# Patient Record
Sex: Male | Born: 1970 | Hispanic: No | Marital: Married | Smoking: Current every day smoker
Health system: Southern US, Community
[De-identification: ages and names within clinical notes are randomized; demographics above are authoritative.]

## PROBLEM LIST (undated history)

## (undated) DIAGNOSIS — J189 Pneumonia, unspecified organism: Secondary | ICD-10-CM

---

## 2007-01-01 ENCOUNTER — Emergency Department: Payer: Self-pay | Admitting: Unknown Physician Specialty

## 2016-06-30 ENCOUNTER — Other Ambulatory Visit: Payer: Self-pay | Admitting: Gastroenterology

## 2016-06-30 DIAGNOSIS — R1084 Generalized abdominal pain: Secondary | ICD-10-CM

## 2018-04-24 ENCOUNTER — Other Ambulatory Visit: Payer: Self-pay

## 2018-04-24 ENCOUNTER — Emergency Department: Payer: BLUE CROSS/BLUE SHIELD

## 2018-04-24 ENCOUNTER — Emergency Department
Admission: EM | Admit: 2018-04-24 | Discharge: 2018-04-24 | Disposition: A | Discharge status: Home / Self Care | Payer: BLUE CROSS/BLUE SHIELD | Attending: Emergency Medicine | Admitting: Emergency Medicine

## 2018-04-24 ENCOUNTER — Encounter: Payer: Self-pay | Admitting: Emergency Medicine

## 2018-04-24 DIAGNOSIS — R0602 Shortness of breath: Secondary | ICD-10-CM | POA: Diagnosis not present

## 2018-04-24 DIAGNOSIS — F17200 Nicotine dependence, unspecified, uncomplicated: Secondary | ICD-10-CM | POA: Diagnosis not present

## 2018-04-24 DIAGNOSIS — J209 Acute bronchitis, unspecified: Secondary | ICD-10-CM | POA: Insufficient documentation

## 2018-04-24 DIAGNOSIS — R05 Cough: Secondary | ICD-10-CM | POA: Diagnosis present

## 2018-04-24 HISTORY — DX: Pneumonia, unspecified organism: J18.9

## 2018-04-24 LAB — CBC WITH DIFFERENTIAL/PLATELET
BASOS ABS: 0.1 10*3/uL (ref 0–0.1)
BASOS PCT: 1 %
EOS ABS: 0.3 10*3/uL (ref 0–0.7)
Eosinophils Relative: 3 %
HCT: 43.3 % (ref 40.0–52.0)
HEMOGLOBIN: 15.4 g/dL (ref 13.0–18.0)
Lymphocytes Relative: 26 %
Lymphs Abs: 2.9 10*3/uL (ref 1.0–3.6)
MCH: 30.5 pg (ref 26.0–34.0)
MCHC: 35.6 g/dL (ref 32.0–36.0)
MCV: 85.6 fL (ref 80.0–100.0)
MONO ABS: 0.8 10*3/uL (ref 0.2–1.0)
Monocytes Relative: 7 %
NEUTROS PCT: 63 %
Neutro Abs: 7.4 10*3/uL — ABNORMAL HIGH (ref 1.4–6.5)
Platelets: 158 10*3/uL (ref 150–440)
RBC: 5.06 MIL/uL (ref 4.40–5.90)
RDW: 14.2 % (ref 11.5–14.5)
WBC: 11.5 10*3/uL — ABNORMAL HIGH (ref 3.8–10.6)

## 2018-04-24 LAB — COMPREHENSIVE METABOLIC PANEL
ALBUMIN: 4.5 g/dL (ref 3.5–5.0)
ALT: 36 U/L (ref 0–44)
ANION GAP: 6 (ref 5–15)
AST: 19 U/L (ref 15–41)
Alkaline Phosphatase: 73 U/L (ref 38–126)
BILIRUBIN TOTAL: 0.7 mg/dL (ref 0.3–1.2)
BUN: 15 mg/dL (ref 6–20)
CHLORIDE: 107 mmol/L (ref 98–111)
CO2: 24 mmol/L (ref 22–32)
Calcium: 9 mg/dL (ref 8.9–10.3)
Creatinine, Ser: 0.97 mg/dL (ref 0.61–1.24)
GFR calc Af Amer: >60 mL/min (ref >60)
GFR calc non Af Amer: >60 mL/min (ref >60)
Glucose, Bld: 111 mg/dL — ABNORMAL HIGH (ref 70–99)
POTASSIUM: 3.3 mmol/L — AB (ref 3.5–5.1)
SODIUM: 137 mmol/L (ref 135–145)
Total Protein: 7.2 g/dL (ref 6.5–8.1)

## 2018-04-24 LAB — TROPONIN I: Troponin I: <0.03 ng/mL (ref <0.03)

## 2018-04-24 MED ORDER — ALBUTEROL SULFATE HFA 108 (90 BASE) MCG/ACT IN AERS: 2.0000 | INHALATION_SPRAY | Freq: Four times a day (QID) | RESPIRATORY_TRACT | 0 refills | Status: AC | PRN

## 2018-04-24 MED ORDER — AZITHROMYCIN 500 MG PO TABS: 500.0000 mg | ORAL_TABLET | Freq: Every day | ORAL | 0 refills | Status: AC

## 2018-04-24 MED ORDER — BENZONATATE 100 MG PO CAPS: 100.0000 mg | ORAL_CAPSULE | Freq: Three times a day (TID) | ORAL | 0 refills | Status: AC | PRN

## 2018-04-24 MED ORDER — PREDNISONE 20 MG PO TABS: 60.0000 mg | ORAL_TABLET | Freq: Every day | ORAL | 0 refills | Status: AC

## 2018-04-24 MED ORDER — IPRATROPIUM-ALBUTEROL 0.5-2.5 (3) MG/3ML IN SOLN
3.0000 mL | Freq: Once | RESPIRATORY_TRACT | Status: AC
  Administered 2018-04-24: 3 mL via RESPIRATORY_TRACT
  Filled 2018-04-24: qty 3

## 2018-04-24 MED ORDER — IOHEXOL 350 MG/ML SOLN
75.0000 mL | Freq: Once | INTRAVENOUS | Status: AC | PRN
  Administered 2018-04-24: 75 mL via INTRAVENOUS

## 2018-04-24 NOTE — ED Triage Notes (Addendum)
Patient ambulatory to triage with steady gait, without difficulty or distress noted; pt reports tonight chest tightness and hemoptysis with some SHOB;  prod cough green sputum several wks; st hx pneumonia; mask placed on pt with instructions to wear until placed in exam room

## 2018-04-24 NOTE — ED Notes (Signed)
Pt resting, with VSS, in NAD at this time, displaying equal and unlabored respirations. Pt family at bedside. All deny needs at this time. Will continue to monitor.

## 2018-04-24 NOTE — ED Notes (Signed)
ED Provider at bedside going over discharge instructions

## 2018-04-24 NOTE — ED Provider Notes (Signed)
[facility] Emergency Department Provider Note    First MD Initiated Contact with Patient 04/24/18 [phone]     (approximate)  I have reviewed the triage vital signs and the nursing notes.   HISTORY  Chief Complaint Cough and Shortness of Breath    HPI Ryan Sparks is a 47 y.o. male with history of pneumonia and half a pack cigarette use daily presents to the emergency department with 1 month history of productive cough with green sputum.  Patient states that tonight he had a episode of hemoptysis that was approximately half a cup to a cup of blood.  Patient denies any fever afebrile on presentation.   Past Medical History:  Diagnosis Date  . Pneumonia     There are no active problems to display for this patient.  Past surgical history None  Prior to Admission medications   Medication Sig Start Date End Date Taking? Authorizing Provider  albuterol (PROVENTIL HFA;VENTOLIN HFA) 108 (90 Base) MCG/ACT inhaler Inhale 2 puffs into the lungs every 6 (six) hours as needed for wheezing or shortness of breath. 04/24/18   Darci Current, MD  azithromycin (ZITHROMAX) 500 MG tablet Take 1 tablet (500 mg total) by mouth daily for 3 days. Take 1 tablet daily for 3 days. 04/24/18 04/27/18  Darci Current, MD  benzonatate (TESSALON PERLES) 100 MG capsule Take 1 capsule (100 mg total) by mouth 3 (three) times daily as needed for cough. 04/24/18 04/24/19  Darci Current, MD  predniSONE (DELTASONE) 20 MG tablet Take 3 tablets (60 mg total) by mouth daily for 5 days. 04/24/18 04/29/18  Darci Current, MD    Allergies No known drug allergies No family history on file.  Social History Social History   Tobacco Use  . Smoking status: Current Every Day Smoker  . Smokeless tobacco: Never Used  Substance Use Topics  . Alcohol use: Not on file  . Drug use: Not on file    Review of Systems Constitutional: No fever/chills Eyes: No visual changes. ENT: No sore  throat. Cardiovascular: Denies chest pain. Respiratory: Positive for cough, hemoptysis. Gastrointestinal: No abdominal pain.  No nausea, no vomiting.  No diarrhea.  No constipation. Genitourinary: Negative for dysuria. Musculoskeletal: Negative for neck pain.  Negative for back pain. Integumentary: Negative for rash. Neurological: Negative for headaches, focal weakness or numbness.   ____________________________________________   PHYSICAL EXAM:  VITAL SIGNS: ED Triage Vitals [04/24/18 0020]  Enc Vitals Group     BP (!) 149/101     Pulse Rate 68     Resp 18     Temp 97.8 F (36.6 C)     Temp Source Oral     SpO2 97 %     Weight 73.5 kg (162 lb)     Height 1.575 m (5\' 2" )     Head Circumference      Peak Flow      Pain Score 3     Pain Loc      Pain Edu?      Excl. in GC?      Constitutional: Alert and oriented. Well appearing and in no acute distress. Eyes: Conjunctivae are normal. PERRL. EOMI. Head: Atraumatic. Mouth/Throat: Mucous membranes are moist.  Oropharynx non-erythematous. Neck: No stridor.   Cardiovascular: Normal rate, regular rhythm. Good peripheral circulation. Grossly normal heart sounds. Respiratory: Normal respiratory effort.  No retractions.  Diffuse rhonchi with expiratory wheezes. Gastrointestinal: Soft and nontender. No distention.  Musculoskeletal: No lower extremity  tenderness nor edema. No gross deformities of extremities. Neurologic:  Normal speech and language. No gross focal neurologic deficits are appreciated.  Skin:  Skin is warm, dry and intact. No rash noted. Psychiatric: Mood and affect are normal. Speech and behavior are normal.  ____________________________________________   LABS (all labs ordered are listed, but only abnormal results are displayed)  Labs Reviewed  CBC WITH DIFFERENTIAL/PLATELET - Abnormal; Notable for the following components:      Result Value   WBC 11.5 (*)    Neutro Abs 7.4 (*)    All other components  within normal limits  COMPREHENSIVE METABOLIC PANEL - Abnormal; Notable for the following components:   Potassium 3.3 (*)    Glucose, Bld 111 (*)    All other components within normal limits  TROPONIN I   ____________________________________________  EKG  ED ECG REPORT I, [place] N Sachin Ferencz, the attending physician, personally viewed and interpreted this ECG.   Date: 04/24/2018  EKG Time: 12:38 AM  Rate: 69  Rhythm: Normal sinus rhythm  Axis: Normal  Intervals: Normal  ST&T Change: None  ____________________________________________  RADIOLOGY I, [place] N Lazaro Isenhower, personally viewed and evaluated these images (plain radiographs) as part of my medical decision making, as well as reviewing the written report by the radiologist.  ED MD interpretation: Findings concerning for lingular pneumonia.  Official radiology report(s): Dg Chest 2 View  Result Date: 04/24/2018 CLINICAL DATA:  Cough EXAM: CHEST - 2 VIEW COMPARISON:  None. FINDINGS: Normal heart size. Normal mediastinal contour. No pneumothorax. No pleural effusion. Mild patchy opacity in the lingula. IMPRESSION: Mild patchy opacity in the lingula, suggestive of a lingular pneumonia. Recommend follow-up PA and lateral post treatment chest radiographs in 4-6 weeks. Electronically Signed   By: Delbert PhenixJason A Poff M.D.   On: 04/24/2018 01:25   Ct Angio Chest Pe W And/or Wo Contrast  Result Date: 04/24/2018 CLINICAL DATA:  47 y/o M; chest tightness, hemoptysis, shortness of breath. EXAM: CT ANGIOGRAPHY CHEST WITH CONTRAST TECHNIQUE: Multidetector CT imaging of the chest was performed using the standard protocol during bolus administration of intravenous contrast. Multiplanar CT image reconstructions and MIPs were obtained to evaluate the vascular anatomy. CONTRAST:  75mL OMNIPAQUE IOHEXOL 350 MG/ML SOLN COMPARISON:  None. FINDINGS: Cardiovascular: Satisfactory opacification of the pulmonary arteries to the segmental level. No evidence of  pulmonary embolism. Normal heart size. No pericardial effusion. Mild aortic calcific atherosclerosis. Mediastinum/Nodes: No enlarged mediastinal, hilar, or axillary lymph nodes. Thyroid gland, trachea, and esophagus demonstrate no significant findings. Lungs/Pleura: Diffuse peribronchial thickening greatest in the lower lobes. Mild right middle lobe bronchiectasis. No consolidation. No pleural effusion or pneumothorax. Upper Abdomen: No acute abnormality. Musculoskeletal: No chest wall abnormality. No acute or significant osseous findings. Review of the MIP images confirms the above findings. IMPRESSION: 1. No pulmonary embolus identified. 2. Diffuse peribronchial thickening compatible with acute bronchitis or reactive airways disease. Mild bronchiectasis within the right middle lobe. No consolidation. 3.  Aortic Atherosclerosis (ICD10-I70.0). Electronically Signed   By: Mitzi HansenLance  Furusawa-Stratton M.D.   On: 04/24/2018 06:20      Critical Care performed:   Procedures   ____________________________________________   INITIAL IMPRESSION / ASSESSMENT AND PLAN / ED COURSE  As part of my medical decision making, I reviewed the following data within the electronic MEDICAL RECORD NUMBER   47 year old male presents with above-stated history and physical exam cough and hemoptysis chest x-ray findings consistent with possible lingular pneumonia CT scan findings more consistent with bronchitis.  Patient given azithromycin Tessalon  prednisone and albuterol inhaler for home with recommendation to follow-up with primary care provider for further outpatient management.  Also discussed smoking cessation with the patient as well. ____________________________________________  FINAL CLINICAL IMPRESSION(S) / ED DIAGNOSES  Final diagnoses:  Acute bronchitis, unspecified organism     MEDICATIONS GIVEN DURING THIS VISIT:  Medications  ipratropium-albuterol (DUONEB) 0.5-2.5 (3) MG/3ML nebulizer solution 3 mL (3 mLs  Nebulization Given 04/24/18 0415)  iohexol (OMNIPAQUE) 350 MG/ML injection 75 mL (75 mLs Intravenous Contrast Given 04/24/18 0445)     ED Discharge Orders         Ordered    azithromycin (ZITHROMAX) 500 MG tablet  Daily     04/24/18 0625    albuterol (PROVENTIL HFA;VENTOLIN HFA) 108 (90 Base) MCG/ACT inhaler  Every 6 hours PRN     04/24/18 0625    predniSONE (DELTASONE) 20 MG tablet  Daily     04/24/18 0625    benzonatate (TESSALON PERLES) 100 MG capsule  3 times daily PRN     04/24/18 1610           Note:  This document was prepared using Dragon voice recognition software and may include unintentional dictation errors.    Darci Current, MD 04/24/18 2222

## 2018-04-24 NOTE — ED Notes (Signed)
Patient transported to CT 

## 2019-05-15 IMAGING — CR DG CHEST 2V
2 series · 2 of 2 positions shown · non-contrast
Comparison: None.

CLINICAL DATA: Cough

EXAM:
CHEST - 2 VIEW

[chest pa]
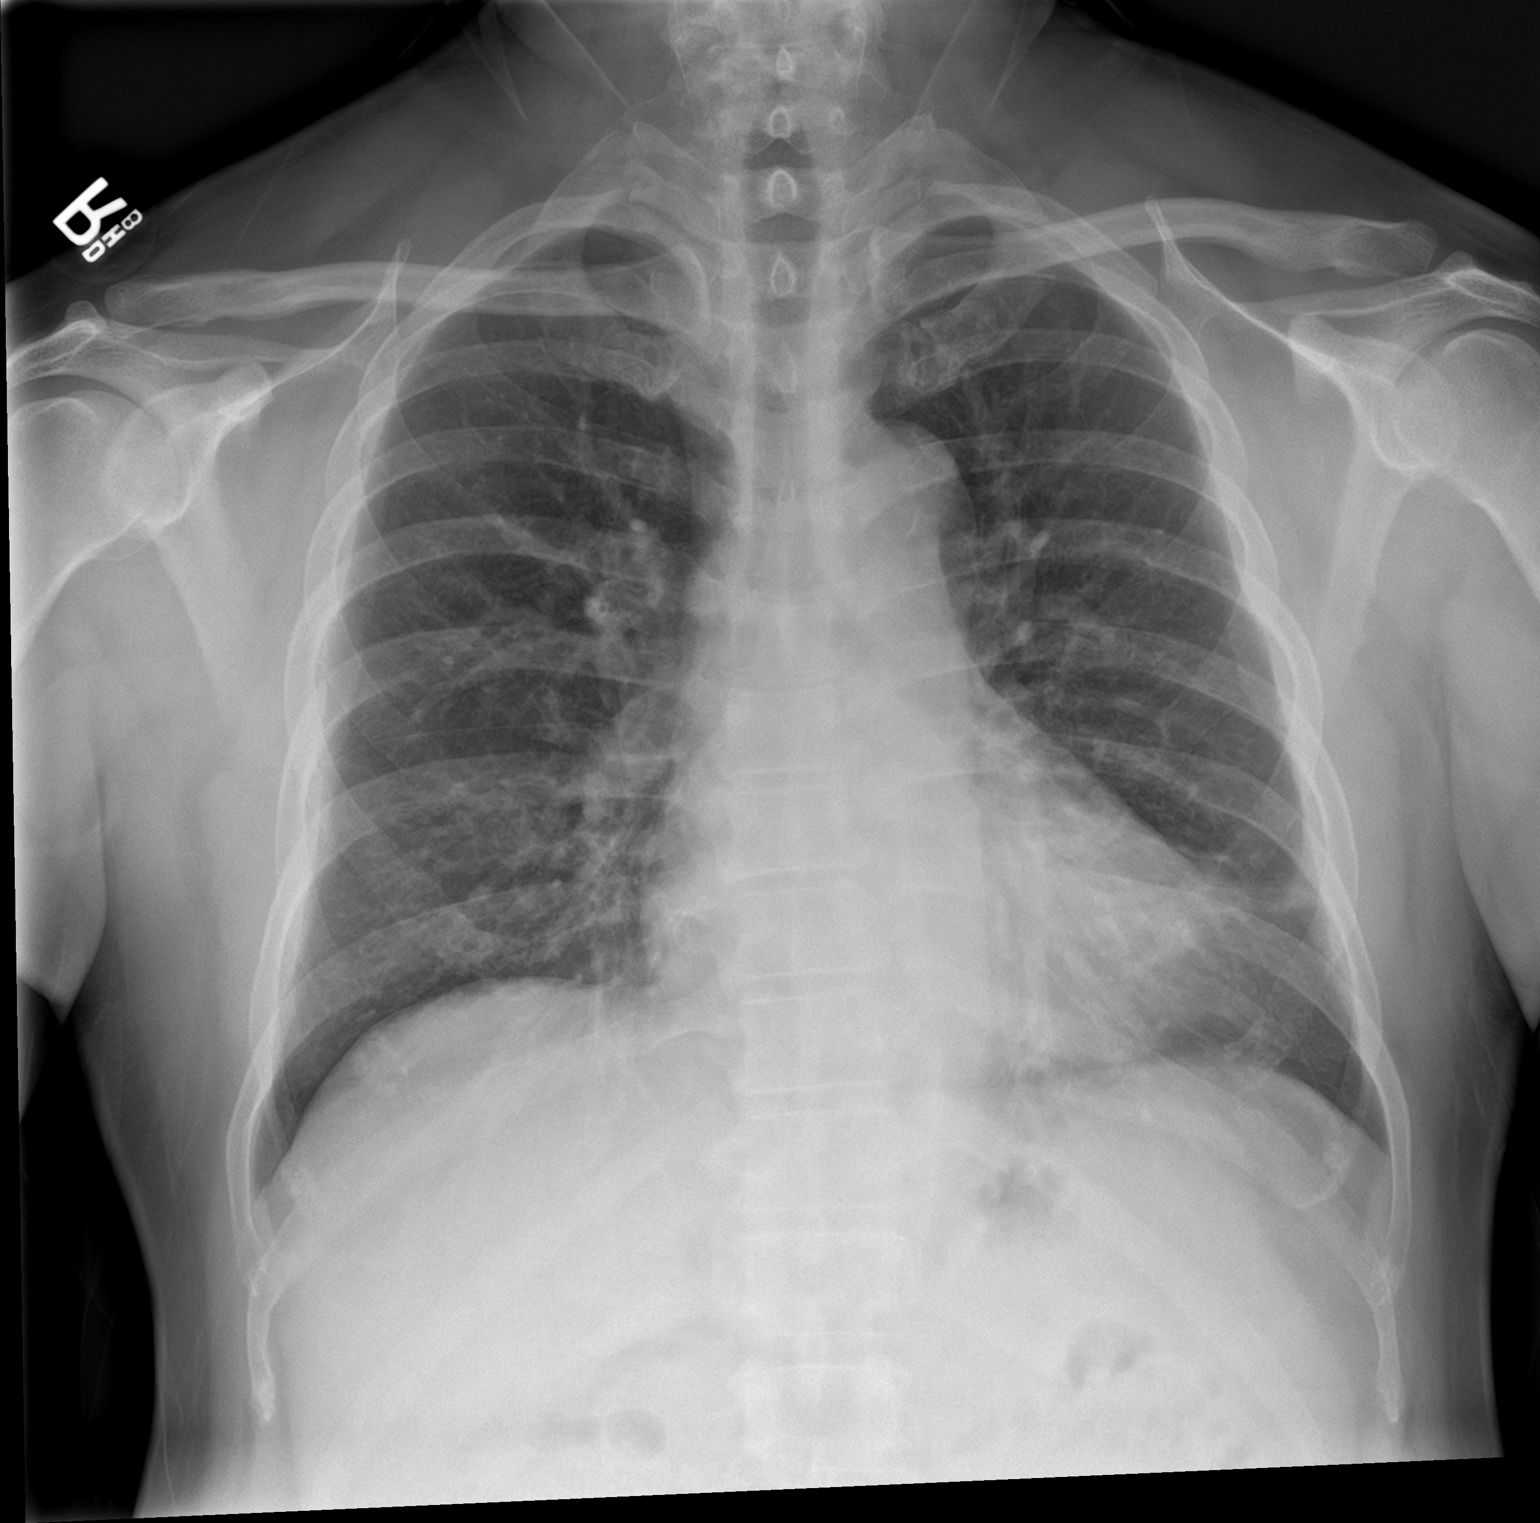

[chest lat]
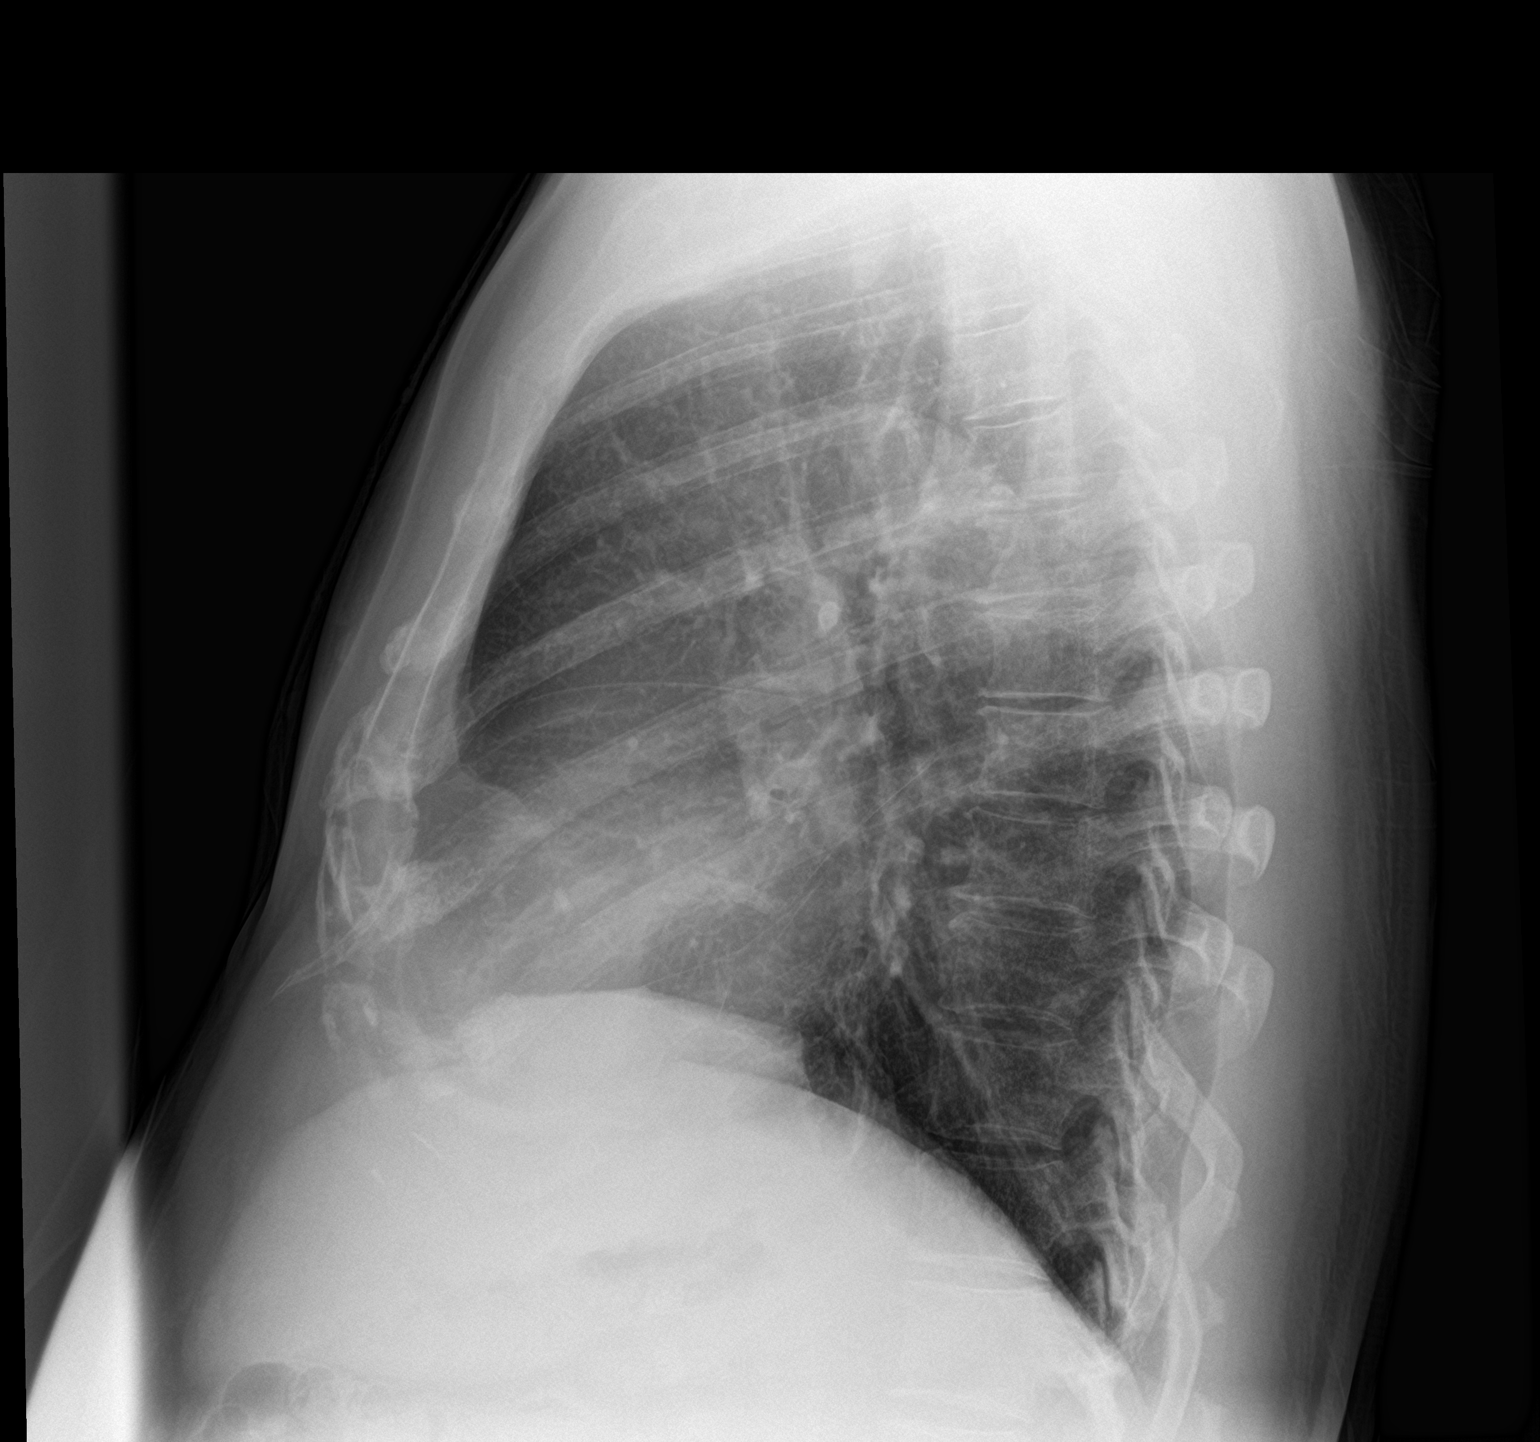

[2 of 2 positions shown; findings below may reference images not displayed]

FINDINGS: Normal heart size. Normal mediastinal contour. No pneumothorax. No
pleural effusion. Mild patchy opacity in the lingula.
IMPRESSION: Mild patchy opacity in the lingula, suggestive of a lingular
pneumonia. Recommend follow-up PA and lateral post treatment chest
radiographs in 4-6 weeks.

## 2019-05-15 IMAGING — CT CT ANGIO CHEST
2 of 6 series · 19 of 46 positions shown · IV contrast (APPLIED)
Comparison: None.

CLINICAL DATA: 46 y/o M; chest tightness, hemoptysis, shortness of
breath.

EXAM:
CT ANGIOGRAPHY CHEST WITH CONTRAST
TECHNIQUE: Multidetector CT imaging of the chest was performed using the
standard protocol during bolus administration of intravenous
contrast. Multiplanar CT image reconstructions and MIPs were
obtained to evaluate the vascular anatomy.
CONTRAST:  75mL OMNIPAQUE IOHEXOL 350 MG/ML SOLN

[Series 5: thins · axial · 0.66mm/px · z∈[-668,-391]mm · 16 of 305 slices shown]
[im 14/305  lung]
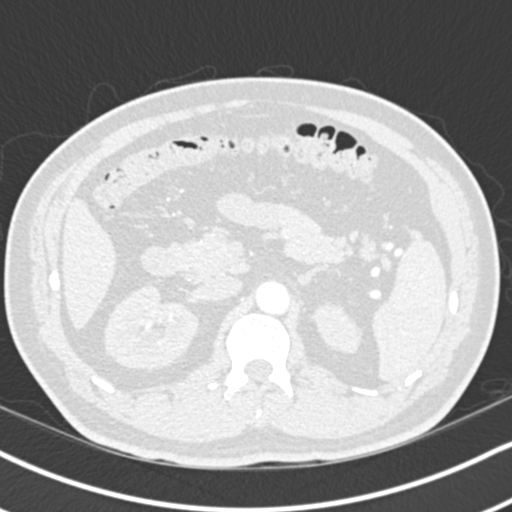
[im 40/305  soft-tissue]
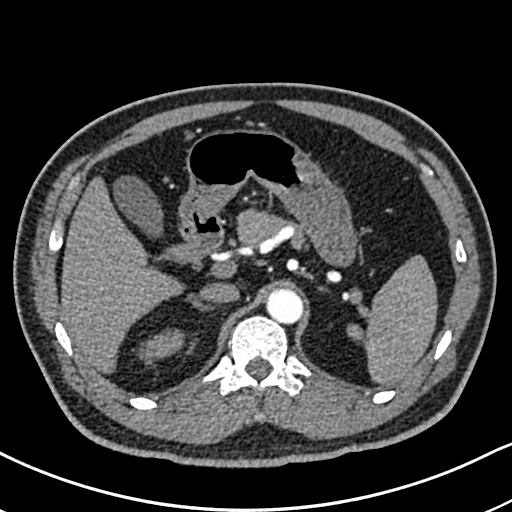
[im 53/305  lung]
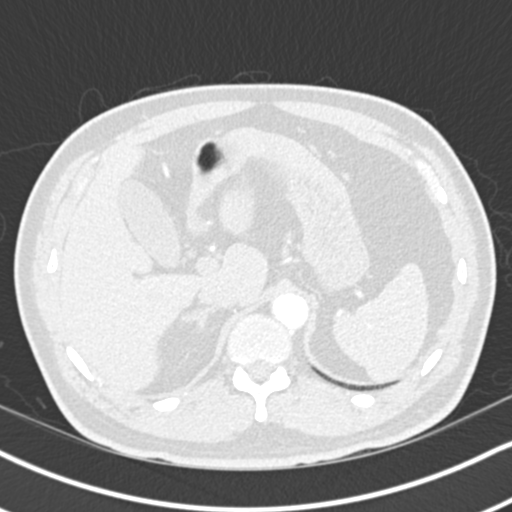
[im 67/305  soft-tissue]
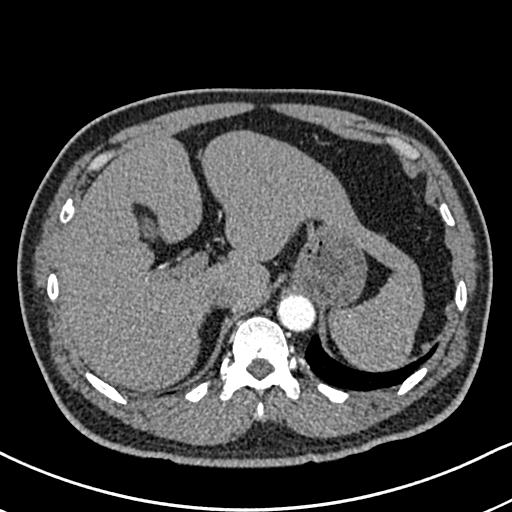
[im 93/305  lung]
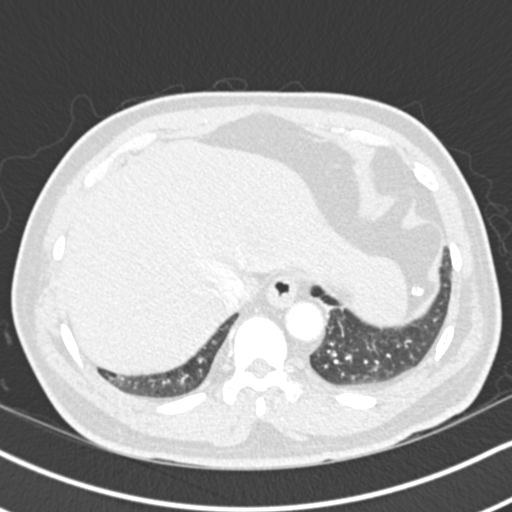
[im 106/305  soft-tissue]
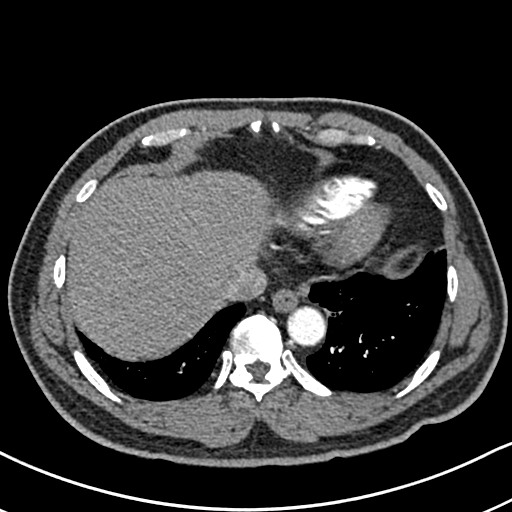
[im 119/305  lung]
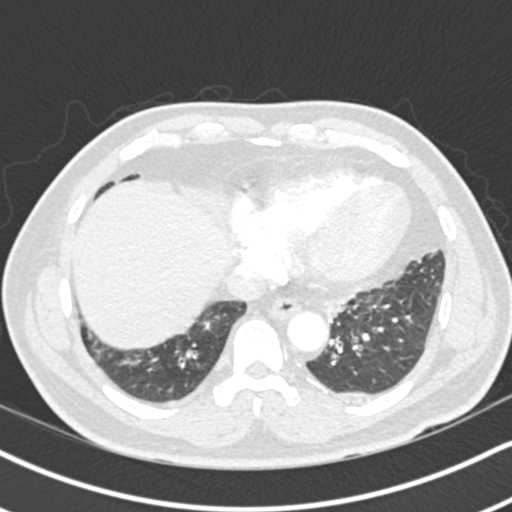
[im 146/305  soft-tissue]
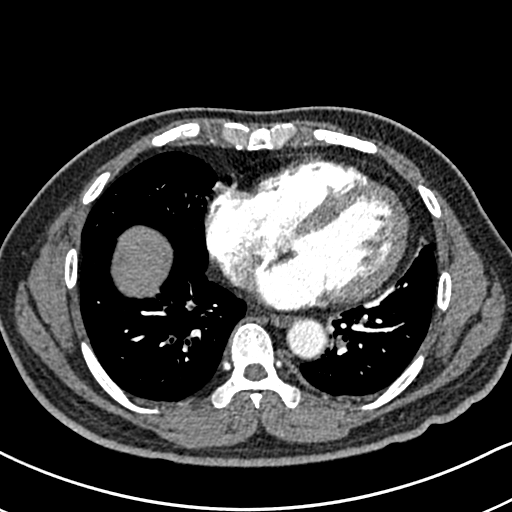
[im 159/305  lung]
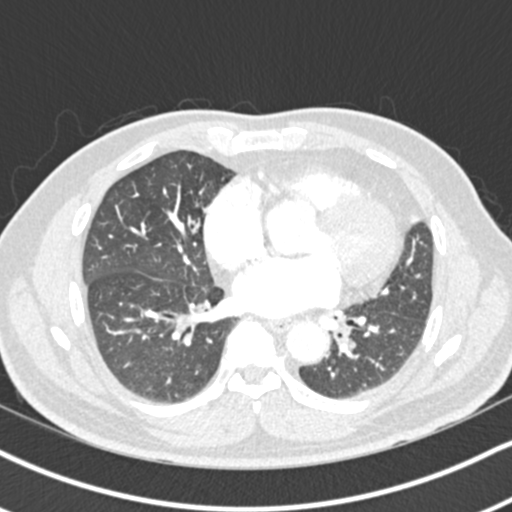
[im 186/305  soft-tissue]
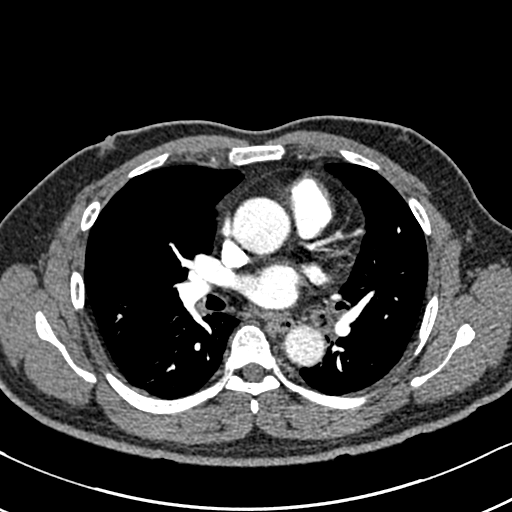
[im 199/305  lung]
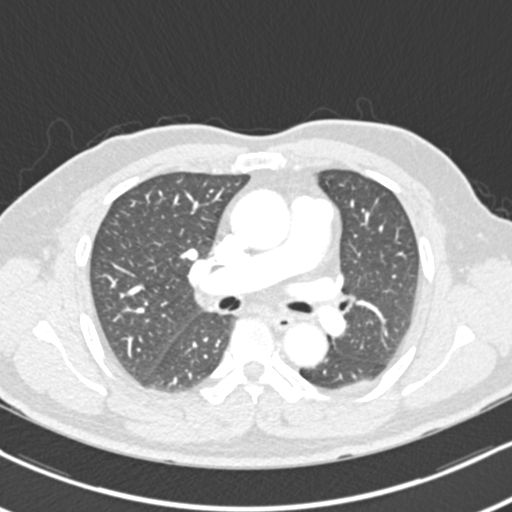
[im 212/305  soft-tissue]
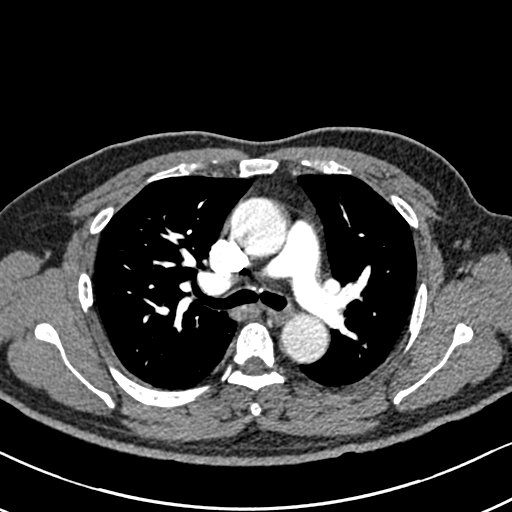
[im 238/305  lung]
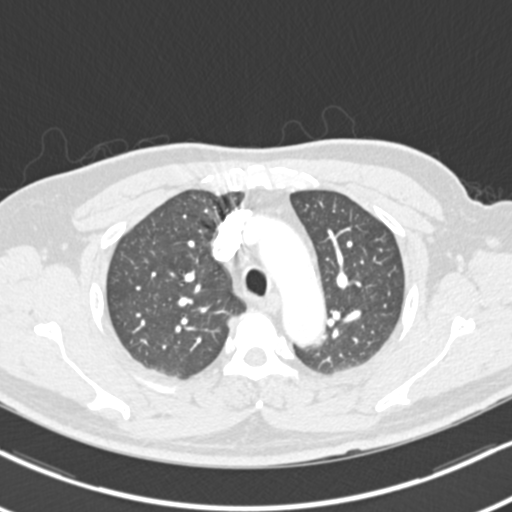
[im 252/305  soft-tissue]
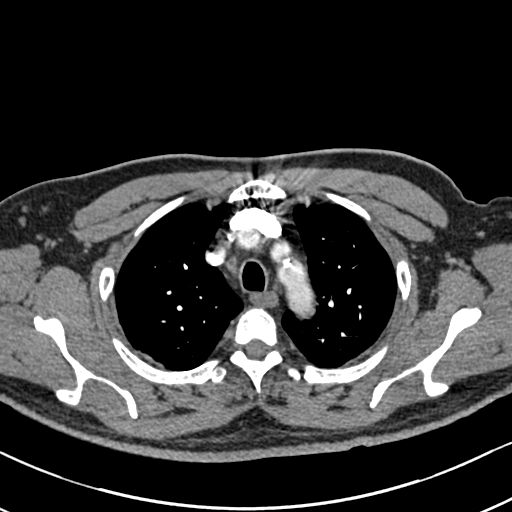
[im 265/305  lung]
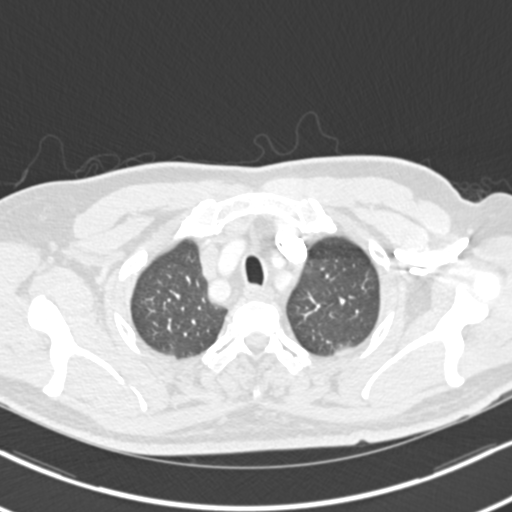
[im 291/305  soft-tissue]
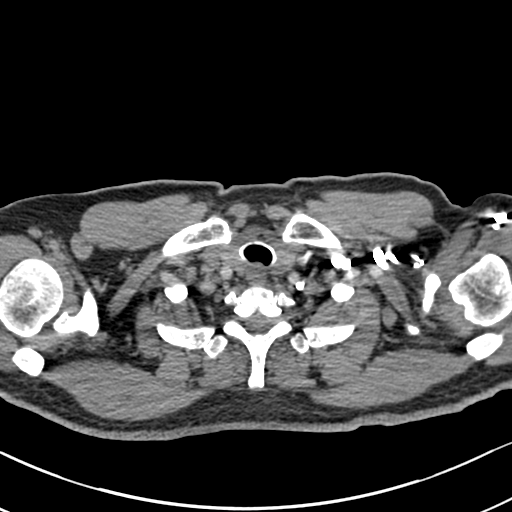

[Series 7: coronal mpr · coronal · 0.60mm/px · 3 of 81 slices shown]
[im 21/81  soft-tissue]
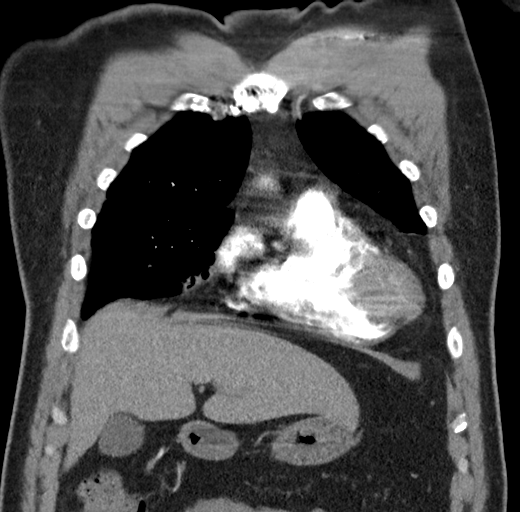
[im 41/81  soft-tissue]
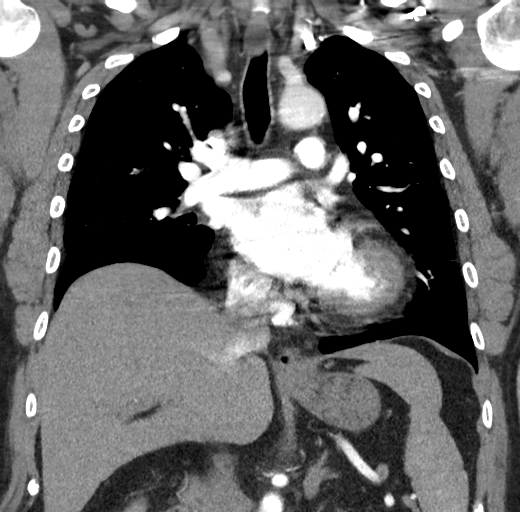
[im 61/81  soft-tissue]
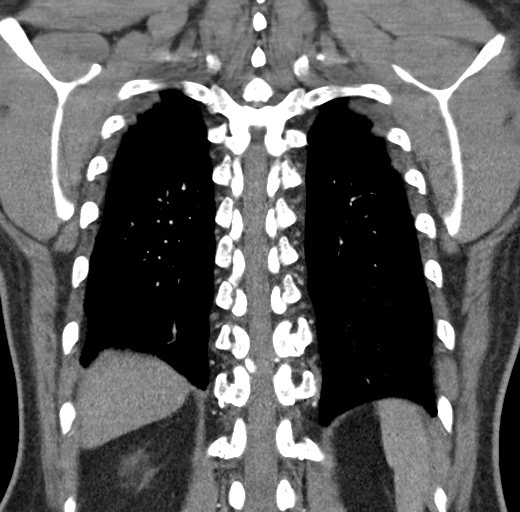

[19 of 46 positions shown; findings below may reference images not displayed]

FINDINGS: Cardiovascular: Satisfactory opacification of the pulmonary arteries
to the segmental level. No evidence of pulmonary embolism. Normal
heart size. No pericardial effusion. Mild aortic calcific
atherosclerosis.

Mediastinum/Nodes: No enlarged mediastinal, hilar, or axillary lymph
nodes. Thyroid gland, trachea, and esophagus demonstrate no
significant findings.

Lungs/Pleura: Diffuse peribronchial thickening greatest in the lower
lobes. Mild right middle lobe bronchiectasis. No consolidation. No
pleural effusion or pneumothorax.

Upper Abdomen: No acute abnormality.

Musculoskeletal: No chest wall abnormality. No acute or significant
osseous findings.

Review of the MIP images confirms the above findings.
IMPRESSION: 1. No pulmonary embolus identified.
2. Diffuse peribronchial thickening compatible with acute bronchitis
or reactive airways disease. Mild bronchiectasis within the right
middle lobe. No consolidation.
3.  Aortic Atherosclerosis ([id number]).

By: Jens-Arild Daae M.D.
